# Patient Record
Sex: Female | Born: 1965 | Race: Black or African American | Hispanic: No | Marital: Married | Smoking: Current every day smoker
Health system: Southern US, Community
[De-identification: ages and names within clinical notes are randomized; demographics above are authoritative.]

## PROBLEM LIST (undated history)

## (undated) DIAGNOSIS — Z860100 Personal history of colon polyps, unspecified: Secondary | ICD-10-CM

## (undated) DIAGNOSIS — K5909 Other constipation: Secondary | ICD-10-CM

## (undated) DIAGNOSIS — K645 Perianal venous thrombosis: Secondary | ICD-10-CM

## (undated) DIAGNOSIS — I10 Essential (primary) hypertension: Secondary | ICD-10-CM

## (undated) DIAGNOSIS — E7800 Pure hypercholesterolemia, unspecified: Secondary | ICD-10-CM

## (undated) DIAGNOSIS — E119 Type 2 diabetes mellitus without complications: Secondary | ICD-10-CM

## (undated) DIAGNOSIS — M549 Dorsalgia, unspecified: Secondary | ICD-10-CM

## (undated) HISTORY — DX: Perianal venous thrombosis: K64.5

## (undated) HISTORY — DX: Personal history of colonic polyps: Z86.010

## (undated) HISTORY — DX: Pure hypercholesterolemia, unspecified: E78.00

## (undated) HISTORY — DX: Other constipation: K59.09

## (undated) HISTORY — PX: BACK SURGERY: SHX140

## (undated) HISTORY — DX: Type 2 diabetes mellitus without complications: E11.9

## (undated) HISTORY — DX: Dorsalgia, unspecified: M54.9

## (undated) HISTORY — DX: Essential (primary) hypertension: I10

## (undated) HISTORY — DX: Personal history of colon polyps, unspecified: Z86.0100

---

## 2006-12-23 HISTORY — PX: TOTAL ABDOMINAL HYSTERECTOMY W/ BILATERAL SALPINGOOPHORECTOMY: SHX83

## 2008-09-20 ENCOUNTER — Encounter: Admission: RE | Admit: 2008-09-20 | Discharge: 2008-09-20 | Payer: Self-pay | Admitting: Neurosurgery

## 2009-06-16 ENCOUNTER — Encounter: Admission: RE | Admit: 2009-06-16 | Discharge: 2009-06-16 | Payer: Self-pay | Admitting: Orthopedic Surgery

## 2009-08-30 ENCOUNTER — Encounter (HOSPITAL_COMMUNITY): Admission: RE | Admit: 2009-08-30 | Discharge: 2009-11-02 | Payer: Self-pay | Admitting: Orthopedic Surgery

## 2009-11-26 DIAGNOSIS — K5792 Diverticulitis of intestine, part unspecified, without perforation or abscess without bleeding: Secondary | ICD-10-CM

## 2009-11-26 HISTORY — DX: Diverticulitis of intestine, part unspecified, without perforation or abscess without bleeding: K57.92

## 2010-02-20 IMAGING — CT CT CHEST W/ CM
3 of 4 series · 16 of 30 positions shown, 17 images · IV contrast (75CC OMNI 300)
Comparison: None

CLINICAL DATA: Chest wall pain, smoker.

CT CHEST WITH CONTRAST
TECHNIQUE: Multidetector CT imaging of the chest was performed
following the standard protocol during bolus administration of
intravenous contrast.
Contrast: 75 ml of omni 300

[Series 3: routine chest · axial · 0.70mm/px · z∈[-202,-32]mm · 4 of 58 slices shown, 5 images]
[im 12/58  mediastinal]
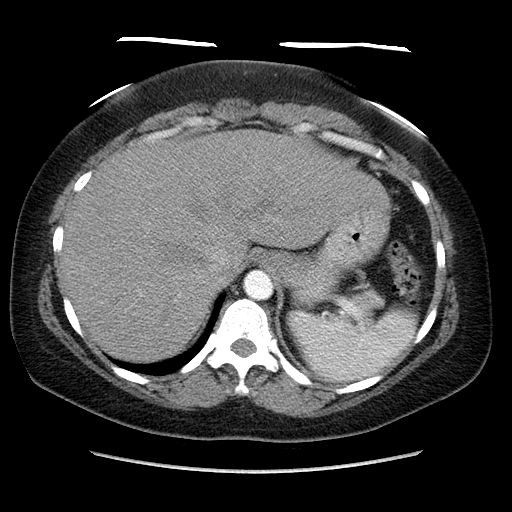
[im 12/58  lung]
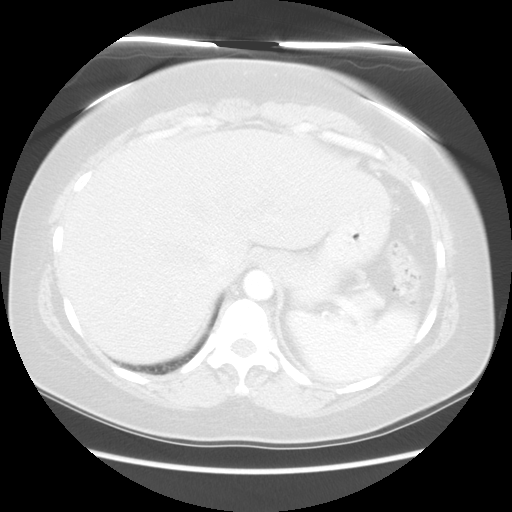
[im 23/58  lung]
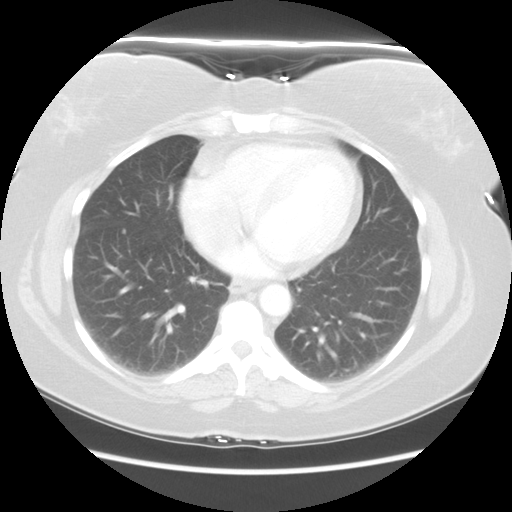
[im 35/58  lung]
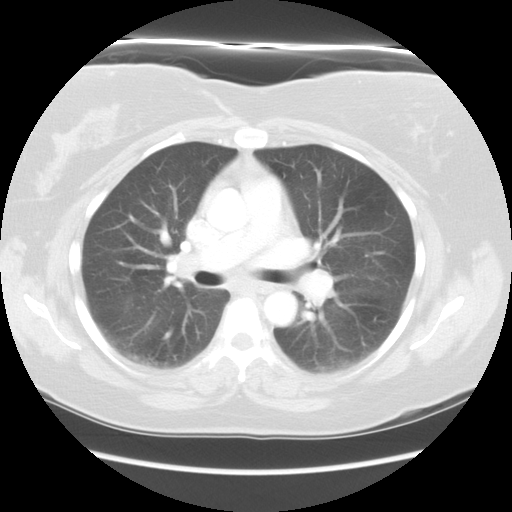
[im 46/58  lung]
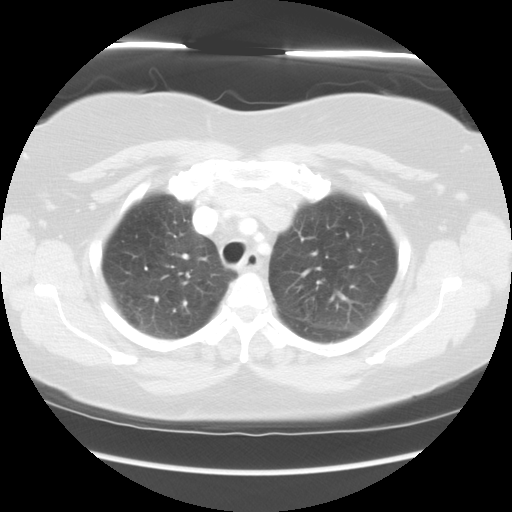

[Series 4: lung windows · axial · 0.70mm/px · z∈[-182,-22]mm · 4 of 54 slices shown]
[im 11/54  lung]
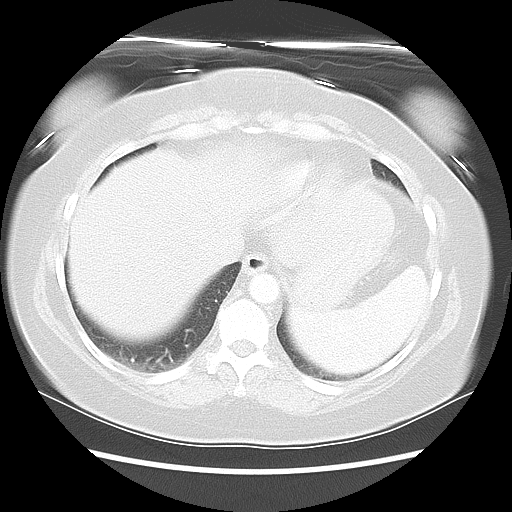
[im 22/54  lung]
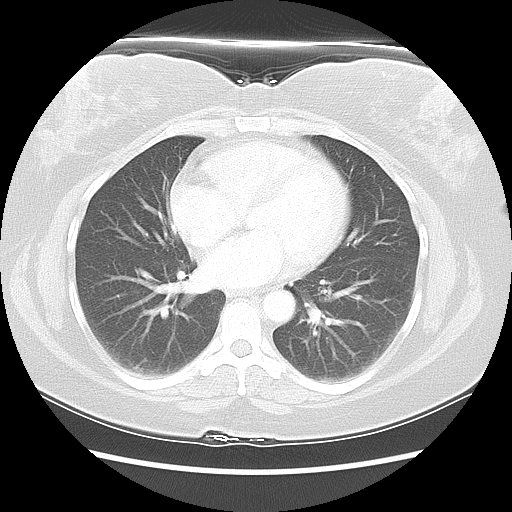
[im 32/54  lung]
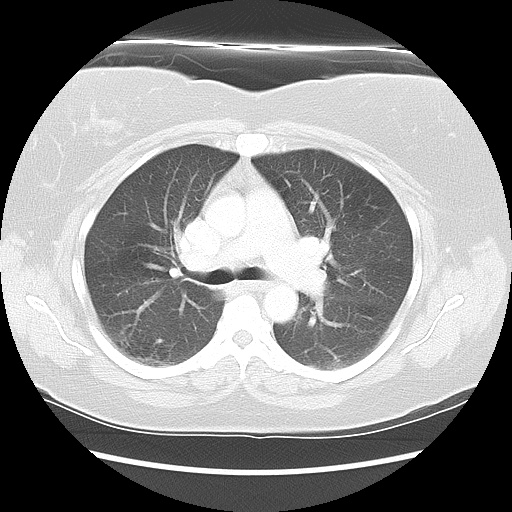
[im 43/54  lung]
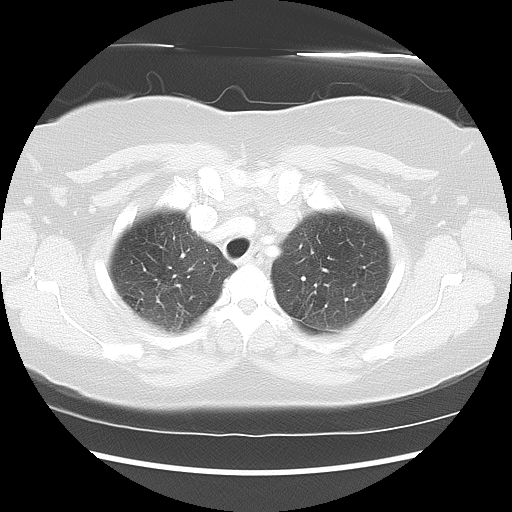

[Series 602: sagittal body · sagittal · 0.70mm/px · 8 of 145 slices shown]
[im 10/145  mediastinal]
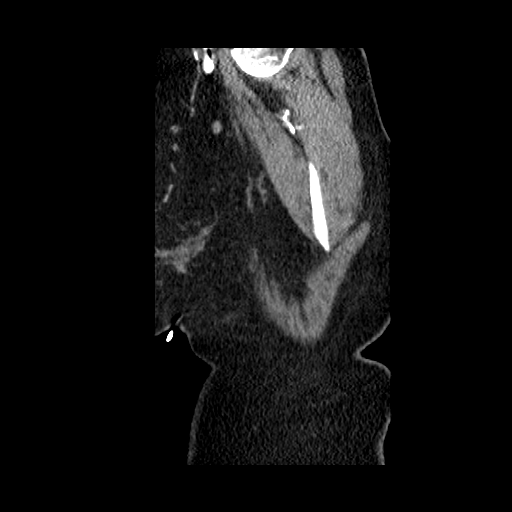
[im 29/145  mediastinal]
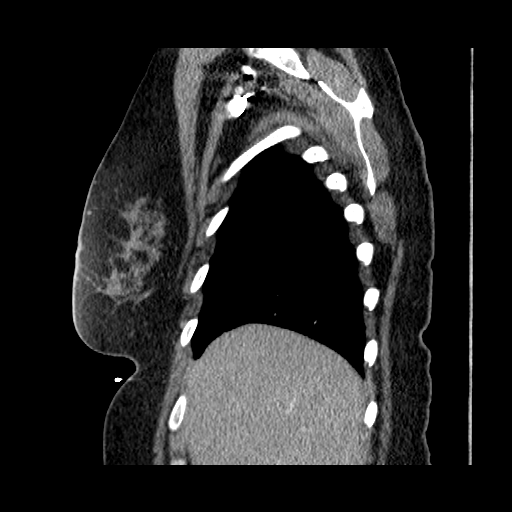
[im 49/145  mediastinal]
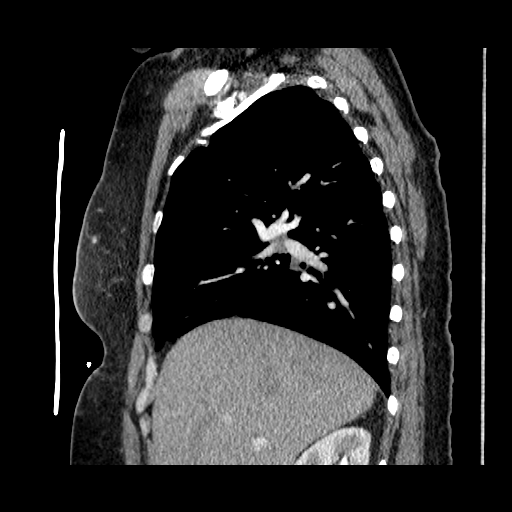
[im 68/145  mediastinal]
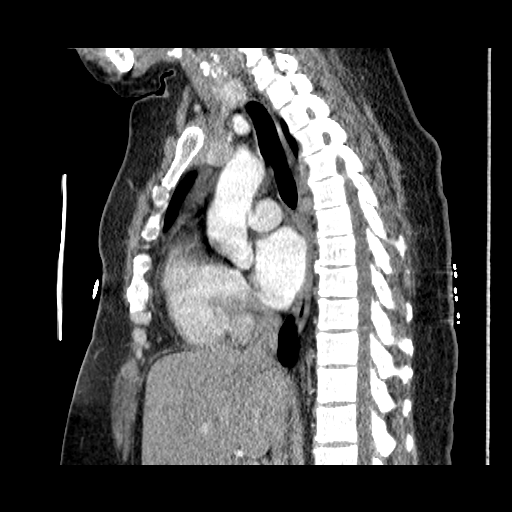
[im 77/145  mediastinal]
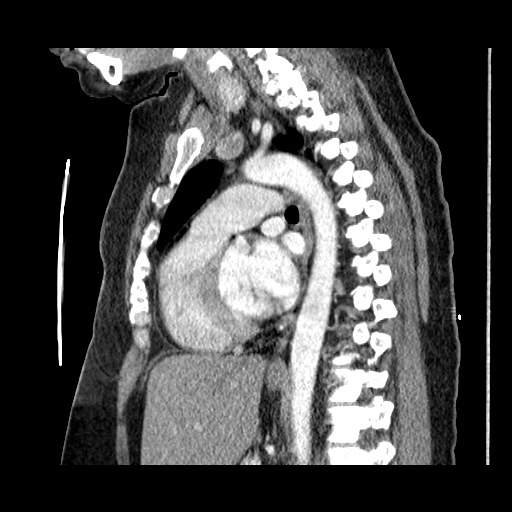
[im 97/145  mediastinal]
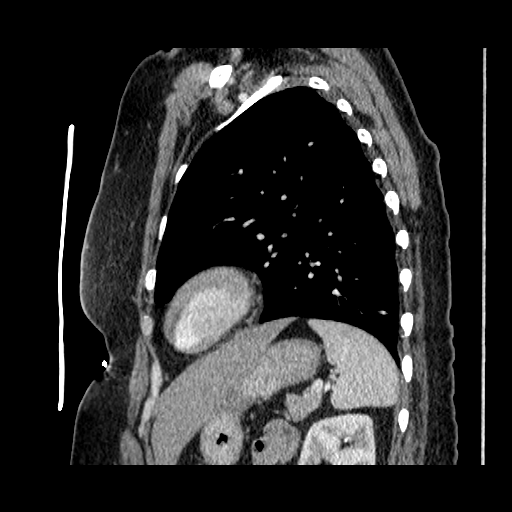
[im 116/145  mediastinal]
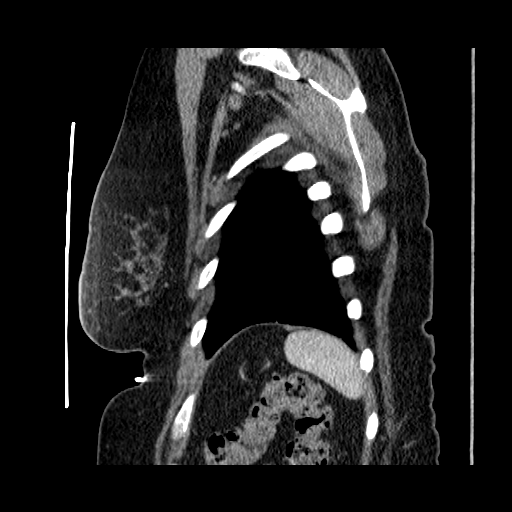
[im 135/145  mediastinal]
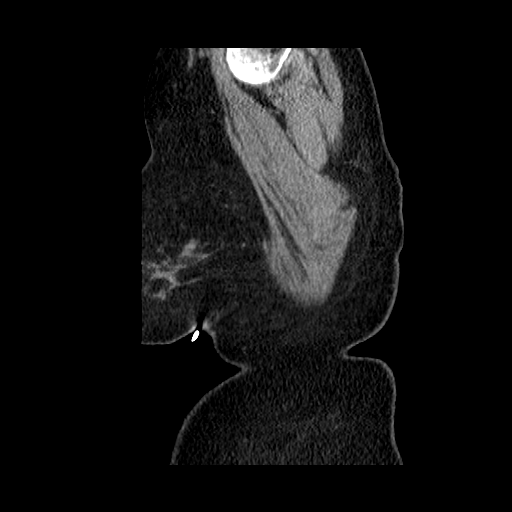

[16 of 30 positions shown; findings below may reference images not displayed]

FINDINGS: No enlarged axillary or supraclavicular lymph nodes.

There are no pathologically enlarged mediastinal or hilar lymph
nodes.

No pericardial or pleural effusion.

The lungs are mildly emphysematous.

No worrisome nodules or masses.

Review of the visualized osseous structures shows no worrisome bone
lesions.

No body wall mass identified.
IMPRESSION: 1.  No acute findings.
2.  Emphysema.

## 2015-12-27 HISTORY — PX: COLONOSCOPY: SHX174

## 2021-09-26 ENCOUNTER — Encounter: Payer: Self-pay | Admitting: Gastroenterology

## 2021-10-29 ENCOUNTER — Ambulatory Visit (INDEPENDENT_AMBULATORY_CARE_PROVIDER_SITE_OTHER): Payer: Self-pay | Admitting: Gastroenterology

## 2021-10-29 ENCOUNTER — Other Ambulatory Visit (INDEPENDENT_AMBULATORY_CARE_PROVIDER_SITE_OTHER): Payer: BC Managed Care – PPO

## 2021-10-29 ENCOUNTER — Encounter: Payer: Self-pay | Admitting: Gastroenterology

## 2021-10-29 VITALS — BP 148/98 | HR 80 | Ht 67.0 in | Wt 245.3 lb

## 2021-10-29 DIAGNOSIS — K581 Irritable bowel syndrome with constipation: Secondary | ICD-10-CM

## 2021-10-29 DIAGNOSIS — K625 Hemorrhage of anus and rectum: Secondary | ICD-10-CM

## 2021-10-29 LAB — CBC WITH DIFFERENTIAL/PLATELET
Basophils Absolute: 0.1 10*3/uL (ref 0.0–0.1)
Basophils Relative: 0.8 % (ref 0.0–3.0)
Eosinophils Absolute: 0.3 10*3/uL (ref 0.0–0.7)
Eosinophils Relative: 3.3 % (ref 0.0–5.0)
HCT: 40.4 % (ref 36.0–46.0)
Hemoglobin: 13.4 g/dL (ref 12.0–15.0)
Lymphocytes Relative: 34.1 % (ref 12.0–46.0)
Lymphs Abs: 2.8 10*3/uL (ref 0.7–4.0)
MCHC: 33.1 g/dL (ref 30.0–36.0)
MCV: 91.4 fl (ref 78.0–100.0)
Monocytes Absolute: 0.5 10*3/uL (ref 0.1–1.0)
Monocytes Relative: 5.6 % (ref 3.0–12.0)
Neutro Abs: 4.6 10*3/uL (ref 1.4–7.7)
Neutrophils Relative %: 56.2 % (ref 43.0–77.0)
Platelets: 251 10*3/uL (ref 150.0–400.0)
RBC: 4.42 Mil/uL (ref 3.87–5.11)
RDW: 14 % (ref 11.5–15.5)
WBC: 8.2 10*3/uL (ref 4.0–10.5)

## 2021-10-29 MED ORDER — HYDROCORTISONE (PERIANAL) 2.5 % EX CREA: 1.0000 "application " | TOPICAL_CREAM | Freq: Two times a day (BID) | CUTANEOUS | 2 refills | Status: AC

## 2021-10-29 MED ORDER — NA SULFATE-K SULFATE-MG SULF 17.5-3.13-1.6 GM/177ML PO SOLN: 1.0000 | Freq: Once | ORAL | 0 refills | Status: AC

## 2021-10-29 NOTE — Patient Instructions (Addendum)
If you are age 58 or older, your body mass index should be between 23-30. Your Body mass index is 38.42 kg/m?Marland Kitchen If this is out of the aforementioned range listed, please consider follow up with your Primary Care Provider. ? ?If you are age 27 or younger, your body mass index should be between 19-25. Your Body mass index is 38.42 kg/m?Marland Kitchen If this is out of the aformentioned range listed, please consider follow up with your Primary Care Provider.  ? ?________________________________________________________ ? ?The [facility] providers would like to encourage you to use [facility] to communicate with providers for non-urgent requests or questions.  Due to long hold times on the telephone, sending your provider a message by [facility] may be a faster and more efficient way to get a response.  Please allow 48 business hours for a response.  Please remember that this is for non-urgent requests.  ?_______________________________________________________  ? ?Please go to the lab on the 2nd floor [address] before you leave the office today.  ? ?We have sent the following medications to your pharmacy for you to pick up at your convenience: ?[place] ?Anusol Cream ? ?Please purchase the following medications over the counter and take as directed: ?Miralax 17g daily in [place] water ? ?Continue stool softerns. ? ?Two days before your procedure: Mix 3 packs (or capfuls) of Miralax in 48 ounces of clear liquid and drink at 6pm. ? ?You have been scheduled for a colonoscopy. Please follow written instructions given to you at your visit today.  ?Please pick up your prep supplies at the pharmacy within the next 1-3 days. ?If you use inhalers (even only as needed), please bring them with you on the day of your procedure. ? ?Please call with any questions or concerns. ? ?Thank you, ? ?Dr. Jackquline Denmark ? ? ? ? ? ?We want to thank you for trusting [facility] with your care. All of our staff and providers value the relationships  we have built with our patients, and it is an honor to care for you.  ? ?We are writing to let you know that [facility] will close on Dec 10, 2021, and we invite you to continue to see Dr. Carmell Austria and Gerrit Heck at the [facility] [place] office location. We are consolidating our serices at these [facility] practices to better provide care. Our office staff will work with you to ensure a seamless transition.  ? ?Gerrit Heck, DO -Dr. Bryan Lemma will be movig to [facility] at [address], [place], [place] [address], effective Dec 10, 2021.  Contact [phone] to schedule an appointment with him.  ? ?Carmell Austria, MD- Dr. Lyndel Safe will be movig to [facility] at [address] [place],  [place] [address], effective Dec 10, 2021.  Contact [phone] to schedule an appointment with him.  ? ?Requesting Medical Records ?If you need to request your medical records, please follow the instructions below. Your medical records are confidential, and a copy can be transferred to another provider or released to you or another person you designate only with your permission. ? ?There are several ways to request your medical records: ?Requests for medical records can be submitted through our practice.   ?You can also request your records electronically, in your MyChart account by selecting the ?Request Health Records? tab.  ?If you need additional information on how to request records, please go to [email or web address] choose Patient Information, then select Request Medical Records. ?To make  an appointment or if you have any questions about your health care needs, please contact our office at [phone] and one of our staff members will be glad to assist you. ?[facility] is committed to providing exceptional care for you and our community. Thank you for allowing [place] to serve your health care needs. ?Sincerely, ? ?Windy Canny, Director [facility] ?[facility] also offers convenient virtual care options. Sore throat? Sinus problems? Cold or flu symptoms? Get care from the comfort of home with [facility] Video Visits and e-Visits. Learn more about the non-emergency conditions treated and start your virtual visit at [email or web address] ? ?

## 2021-10-29 NOTE — Progress Notes (Signed)
? ? ?Chief Complaint:  ? ?Referring Provider:  No ref. provider found    ? ? ?ASSESSMENT AND PLAN;  ? ?#1. Rectal bleeding. D/d hoids, AVMs, colitis, polyps, stercoral ulcers etc, r/o colonic neoplasms or IBD. ? ?#2. IBS-C (with assoc [facility]) ? ?Plan: ?-Colon with 2 day prep ?-CBC today ?-Add Miralax 17g po qd ?-HC 2.5% BID PR x 10 days, 6RF ?-Continie stool softners ? ? ?Discussed risks & benefits of colonoscopy. Risks including rare perforation req laparotomy, bleeding after bx/polypectomy req blood transfusion, rarely missing neoplasms, risks of anesthesia/sedation, rare risk of damage to internal organs. Benefits outweigh the risks. Patient agrees to proceed. All the questions were answered. Pt consents to proceed. ?HPI:   ? ?Jill Wagner is a 56 y.o. female  ?DM2, LBP d/t DJD on meloxicam/percocet, HTN, HLD, asthma/COPD with continued smoking, TAH w/t BSO ? ?C/O Rectal bleed x 1 month-intermittent, mostly bright red in color, at times mixed with the stool.  Denies having any perirectal pain. ? ?Associated constipation, better with stools softners ?Lower abdo pain with abdominal bloating, which gets better with defecation. ?She has been drinking plenty of water. ? ?Unfortunately has to take Percocet for back pain.  She has been using minimal effective dose. ? ?No nausea, vomiting, heartburn, regurgitation, odynophagia or dysphagia. ? ?Nl CBC, CMP 06/25/2021 ? ?Past GI work-up: ?Colonoscopy 11/2015([facility]): Colonic polyps s/p polypectomy, moderate predominantly sigmoid diverticulosis, small internal and external hemorrhoids. Bx-hyperplastic. ? ?SH-production specialist with [employer] ?Past Medical History:  ?Diagnosis Date  ? Acute diverticulitis 11/2009  ? with perforation contained abscess. Followed by Dr Pauletta Browns  ? Back pain   ? Chronic constipation   ? Diabetes ([place])   ? Hemorrhoids, internal, thrombosed   ? on colonoscopy 12-27-2015 with associated constipation  ? High cholesterol   ? History of colon polyps   ?  HTN (hypertension)   ? ? ?Past Surgical History:  ?Procedure Laterality Date  ? BACK SURGERY    ? COLONOSCOPY  12/27/2015  ? Colonic polyps status post polypectomy. Moderate predominantly sigmoid diverticulosis. Small internal and external hemorrhoids.  ? TOTAL ABDOMINAL HYSTERECTOMY W/ BILATERAL SALPINGOOPHORECTOMY  12/23/2006  ? Done by Dr Rolena Infante  ? ? ?Family History  ?Problem Relation Age of Onset  ? Colon cancer Neg Hx   ? Rectal cancer Neg Hx   ? Stomach cancer Neg Hx   ? Esophageal cancer Neg Hx   ? ? ?Social History  ? ?Tobacco Use  ? Smoking status: Every Day  ?  Types: Cigarettes  ? Smokeless tobacco: Never  ?Vaping Use  ? Vaping Use: Never used  ?Substance Use Topics  ? Alcohol use: Yes  ?  Comment: occ  ? Drug use: Never  ? ? ?Current Outpatient Medications  ?Medication Sig Dispense Refill  ? albuterol (VENTOLIN HFA) 108 (90 Base) MCG/ACT inhaler Inhale into the lungs.    ? ALPRAZolam (XANAX) 0.5 MG tablet TAKE 1 TABLET BY MOUTH EVERY 12 HOURS AS NEEDED FOR RESCUE/ANXIETY. REFILLS AFTER PRESCRIPTION EXPIRATION REQUIRE FOLLOW-UP EVALUATION AT THE    ? Cholecalciferol 25 MCG (1000 UT) tablet Take by mouth.    ? Cyanocobalamin 1000 MCG TBCR Take 1 tablet by mouth daily.    ? docusate sodium (COLACE) 50 MG capsule Take by mouth.    ? glipiZIDE (GLUCOTROL XL) 5 MG 24 hr tablet Take 5 mg by mouth daily.    ? hydrochlorothiazide (HYDRODIURIL) 12.5 MG tablet Take 12.5 mg by mouth daily.    ? lisinopril (ZESTRIL) 10  MG tablet Take 10 mg by mouth daily.    ? meloxicam (MOBIC) 15 MG tablet Take 15 mg by mouth daily.    ? Multiple Vitamin (MULTIVITAMIN) capsule Take 1 capsule by mouth daily.    ? oxyCODONE-acetaminophen (PERCOCET) 10-325 MG tablet Take by mouth.    ? rosuvastatin (CRESTOR) 5 MG tablet Take 5 mg by mouth daily.    ? ?No current facility-administered medications for this visit.  ? ? ?Allergies  ?Allergen Reactions  ? Aspirin Other (See Comments)  ?  Abd. Pain  ? Losartan Other (See Comments)  ?   Unknown  ? Meperidine Other (See Comments)  ?  Swelling  ? ? ?Review of Systems:  ?Constitutional: Denies fever, chills, diaphoresis, appetite change and fatigue.  ?HEENT: Denies photophobia, eye pain, redness, hearing loss, ear pain, congestion, sore throat, rhinorrhea, sneezing, mouth sores, neck pain, neck stiffness and tinnitus.   ?Respiratory: Denies SOB, DOE, cough, chest tightness,  and wheezing.  Continued smoking. ?Cardiovascular: Denies chest pain, palpitations and leg swelling.  ?Genitourinary: Denies dysuria, urgency, frequency, hematuria, flank pain and difficulty urinating.  ?Musculoskeletal: Denies myalgias, back pain, joint swelling, arthralgias and gait problem.  ?Skin: No rash.  ?Neurological: Denies dizziness, seizures, syncope, weakness, light-headedness, numbness and headaches.  ?Hematological: Denies adenopathy. Easy bruising, personal or family bleeding history  ?Psychiatric/Behavioral: Has anxiety or depression ? ?  ? ?Physical Exam:   ? ?BP (!) 148/98   Pulse 80   Ht '5\' 7"'$  (1.702 m)   Wt 245 lb 5 oz (111.3 kg)   SpO2 96%   BMI 38.42 kg/m?  ?Wt Readings from Last 3 Encounters:  ?10/29/21 245 lb 5 oz (111.3 kg)  ? ?Constitutional:  Well-developed, in no acute distress. ?Psychiatric: Normal mood and affect. Behavior is normal. ?HEENT: Pupils normal.  Conjunctivae are normal. No scleral icterus. ?Cardiovascular: Normal rate, regular rhythm. No edema ?Pulmonary/chest: Effort normal and breath sounds normal. No wheezing, rales or rhonchi. ?Abdominal: Soft, nondistended. Nontender. Bowel sounds active throughout. There are no masses palpable. No hepatomegaly. ?Rectal: to be performed the time of colonoscopy ?Neurological: Alert and oriented to person place and time. ?Skin: Skin is warm and dry. No rashes noted. ? ? ? ? ? ?Carmell Austria, MD 10/29/2021, 8:41 AM ? ?Cc: No ref. provider found ? ? ?

## 2021-12-10 ENCOUNTER — Encounter: Payer: Self-pay | Admitting: Gastroenterology

## 2021-12-17 ENCOUNTER — Encounter: Payer: Self-pay | Admitting: Gastroenterology

## 2021-12-17 ENCOUNTER — Ambulatory Visit (AMBULATORY_SURGERY_CENTER): Payer: BC Managed Care – PPO | Admitting: Gastroenterology

## 2021-12-17 VITALS — BP 155/95 | HR 75 | Temp 98.6°F | Resp 22 | Ht 67.0 in | Wt 245.0 lb

## 2021-12-17 DIAGNOSIS — D125 Benign neoplasm of sigmoid colon: Secondary | ICD-10-CM

## 2021-12-17 DIAGNOSIS — K625 Hemorrhage of anus and rectum: Secondary | ICD-10-CM

## 2021-12-17 DIAGNOSIS — K621 Rectal polyp: Secondary | ICD-10-CM

## 2021-12-17 DIAGNOSIS — K635 Polyp of colon: Secondary | ICD-10-CM

## 2021-12-17 DIAGNOSIS — D128 Benign neoplasm of rectum: Secondary | ICD-10-CM

## 2021-12-17 DIAGNOSIS — K581 Irritable bowel syndrome with constipation: Secondary | ICD-10-CM

## 2021-12-17 MED ORDER — SODIUM CHLORIDE 0.9 % IV SOLN: 500.0000 mL | Freq: Once | INTRAVENOUS | Status: DC

## 2021-12-17 MED ORDER — HYDROCORTISONE (PERIANAL) 2.5 % EX CREA: 1.0000 "application " | TOPICAL_CREAM | Freq: Two times a day (BID) | CUTANEOUS | 2 refills | Status: AC

## 2021-12-17 NOTE — Op Note (Signed)
[facility] Patient Name: Jill Wagner Procedure Date: 12/17/2021 2:51 PM MRN: [id number] Endoscopist: Jackquline Denmark , MD Age: 56 Referring MD:  Date of Birth: 1966-07-12 Gender: Female Account #: [id number] Procedure:                Colonoscopy Indications:              Rectal bleeding Medicines:                Monitored Anesthesia Care Procedure:                Pre-Anesthesia Assessment:                           - Prior to the procedure, a History and Physical                            was performed, and patient medications and                            allergies were reviewed. The patient's tolerance of                            previous anesthesia was also reviewed. The risks                            and benefits of the procedure and the sedation                            options and risks were discussed with the patient.                            All questions were answered, and informed consent                            was obtained. Prior Anticoagulants: The patient has                            taken no previous anticoagulant or antiplatelet                            agents. ASA Grade Assessment: II - A patient with                            mild systemic disease. After reviewing the risks                            and benefits, the patient was deemed in                            satisfactory condition to undergo the procedure.                           After obtaining informed consent, the colonoscope  was passed under direct vision. Throughout the                            procedure, the patient's blood pressure, pulse, and                            oxygen saturations were monitored continuously. The                            PCF-HQ190L Colonoscope was introduced through the                            anus and advanced to the 2 cm into the ileum. The                            colonoscopy was performed without difficulty.  The                            patient tolerated the procedure well. The quality                            of the bowel preparation was good. The terminal                            ileum, ileocecal valve, appendiceal orifice, and                            rectum were photographed. Scope In: 3:06:33 PM Scope Out: 3:20:36 PM Scope Withdrawal Time: 0 hours 11 minutes 59 seconds  Total Procedure Duration: 0 hours 14 minutes 3 seconds  Findings:                 Two sessile polyps were found in the rectum and                            distal sigmoid colon. The polyps were 2 to 4 mm in                            size. These polyps were removed with a cold snare.                            Resection and retrieval were complete.                           Multiple medium-mouthed diverticula were found in                            the sigmoid colon.                           Non-bleeding external and internal hemorrhoids were                            found during retroflexion and during perianal exam.  The hemorrhoids were moderate.                           The terminal ileum appeared normal.                           The exam was otherwise without abnormality on                            direct and retroflexion views. Complications:            No immediate complications. Estimated Blood Loss:     Estimated blood loss: none. Impression:               - Two 2 to 4 mm polyps in the rectum and in the                            distal sigmoid colon, removed with a cold snare.                            Resected and retrieved.                           - Moderate sigmoid diverticulosis.                           - Non-bleeding external and internal hemorrhoids.                           - The examined portion of the ileum was normal.                           - The examination was otherwise normal on direct                            and retroflexion  views. Recommendation:           - Patient has a contact number available for                            emergencies. The signs and symptoms of potential                            delayed complications were discussed with the                            patient. Return to normal activities tomorrow.                            Written discharge instructions were provided to the                            patient.                           - Resume previous diet.                           -  Continue present medications.                           - Await pathology results.                           - Repeat colonoscopy for surveillance based on                            pathology results.                           - Use HC Cream 2.5%: Apply externally BID for 10                            days prn 2RF.                           - If still with problems with hemorrhoids, would                            consider surgical eval for possible                            hemorrhoidectomy.                           - The findings and recommendations were discussed                            with the patient's family. Jackquline Denmark, MD 12/17/2021 3:25:43 PM This report has been signed electronically.

## 2021-12-17 NOTE — Progress Notes (Signed)
PT taken to PACU. Monitors in place. VSS. Report given to RN. 

## 2021-12-17 NOTE — Progress Notes (Signed)
I have reviewed the patient's medical history in detail and updated the computerized patient record.   VS BY CW. 

## 2021-12-17 NOTE — Progress Notes (Signed)
Called to room to assist during endoscopic procedure.  Patient ID and intended procedure confirmed with present staff. Received instructions for my participation in the procedure from the performing physician.  

## 2021-12-17 NOTE — Patient Instructions (Signed)
Resume previous medications.  2 polyps removed and sent to pathology.  Await results for final recommendations.    Handouts on findings given to patient (polyps, diverticulosis and hemorrhoids)  YOU HAD AN ENDOSCOPIC PROCEDURE TODAY AT [facility]:   Refer to the procedure report that was given to you for any specific questions about what was found during the examination.  If the procedure report does not answer your questions, please call your gastroenterologist to clarify.  If you requested that your care partner not be given the details of your procedure findings, then the procedure report has been included in a sealed envelope for you to review at your convenience later.  YOU SHOULD EXPECT: Some feelings of bloating in the abdomen. Passage of more gas than usual.  Walking can help get rid of the air that was put into your GI tract during the procedure and reduce the bloating. If you had a lower endoscopy (such as a colonoscopy or flexible sigmoidoscopy) you may notice spotting of blood in your stool or on the toilet paper. If you underwent a bowel prep for your procedure, you may not have a normal bowel movement for a few days.  Please Note:  You might notice some irritation and congestion in your nose or some drainage.  This is from the oxygen used during your procedure.  There is no need for concern and it should clear up in a day or so.  SYMPTOMS TO REPORT IMMEDIATELY:  Following lower endoscopy (colonoscopy or flexible sigmoidoscopy):  Excessive amounts of blood in the stool  Significant tenderness or worsening of abdominal pains  Swelling of the abdomen that is new, acute  Fever of 100F or higher   For urgent or emergent issues, a gastroenterologist can be reached at any hour by calling [phone]. Do not use MyChart messaging for urgent concerns.    DIET:  We do recommend a small meal at first, but then you may proceed to your regular diet.  Drink plenty of  fluids but you should avoid alcoholic beverages for 24 hours.  ACTIVITY:  You should plan to take it easy for the rest of today and you should NOT DRIVE or use heavy machinery until tomorrow (because of the sedation medicines used during the test).    FOLLOW UP: Our staff will call the number listed on your records 48-72 hours following your procedure to check on you and address any questions or concerns that you may have regarding the information given to you following your procedure. If we do not reach you, we will leave a message.  We will attempt to reach you two times.  During this call, we will ask if you have developed any symptoms of COVID 19. If you develop any symptoms (ie: fever, flu-like symptoms, shortness of breath, cough etc.) before then, please call [phone].  If you test positive for Covid 19 in the 2 weeks post procedure, please call and report this information to [place].    If any biopsies were taken you will be contacted by phone or by letter within the next 1-3 weeks.  Please call us at [phone] if you have not heard about the biopsies in 3 weeks.    SIGNATURES/CONFIDENTIALITY: You and/or your care partner have signed paperwork which will be entered into your electronic medical record.  These signatures attest to the fact that that the information above on your After Visit Summary has been reviewed and is understood.  Full responsibility of  the confidentiality of this discharge information lies with you and/or your care-partner.

## 2021-12-18 ENCOUNTER — Telehealth: Payer: Self-pay

## 2021-12-18 NOTE — Telephone Encounter (Signed)
No answer, left message to call back later today, B.Jahmari Esbenshade RN. 

## 2021-12-18 NOTE — Telephone Encounter (Signed)
  Follow up Call-     12/17/2021    2:14 PM  Call back number  Post procedure Call Back phone  # [phone]  Permission to leave phone message Yes     Patient questions:  Do you have a fever, pain , or abdominal swelling? No. Pain Score  0 *  Have you tolerated food without any problems? Yes.    Have you been able to return to your normal activities? Yes.    Do you have any questions about your discharge instructions: Diet   No. Medications  No. Follow up visit  No.  Do you have questions or concerns about your Care? No.  Actions: * If pain score is 4 or above: No action needed, pain <4.

## 2021-12-21 ENCOUNTER — Encounter: Payer: Self-pay | Admitting: Gastroenterology

## 2023-10-01 ENCOUNTER — Encounter: Payer: Self-pay | Admitting: Specialist

## 2023-12-04 ENCOUNTER — Ambulatory Visit: Admitting: Podiatry

## 2023-12-04 ENCOUNTER — Ambulatory Visit (INDEPENDENT_AMBULATORY_CARE_PROVIDER_SITE_OTHER)

## 2023-12-04 DIAGNOSIS — M19072 Primary osteoarthritis, left ankle and foot: Secondary | ICD-10-CM

## 2023-12-04 DIAGNOSIS — M778 Other enthesopathies, not elsewhere classified: Secondary | ICD-10-CM

## 2023-12-04 NOTE — Progress Notes (Signed)
 Chief Complaint  Patient presents with   Foot Pain    Left lateral foot pain. Been present for 2 week. She has been using Volteran, and a foot massage, but no relief. She states no injury that she can remember. Not diabetic and no anti coag. Xrays up .    HPI: 58 y.o. female presenting today with c/o pain along the dorsal aspect of the left midfoot along the tarsometatarsal joints when pointing with her finger.  Patient states that she takes pain medication currently for her back but it does not improve the pain in her midfoot.  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  She wants to hold off on any additional oral medication.  Past Medical History:  Diagnosis Date   Acute diverticulitis 11/2009   with perforation contained abscess. Followed by Dr Maple Seltzer   Back pain    Chronic constipation    Diabetes (HCC)    Hemorrhoids, internal, thrombosed    on colonoscopy 12-27-2015 with associated constipation   High cholesterol    History of colon polyps    HTN (hypertension)     Past Surgical History:  Procedure Laterality Date   BACK SURGERY     COLONOSCOPY  12/27/2015   Colonic polyps status post polypectomy. Moderate predominantly sigmoid diverticulosis. Small internal and external hemorrhoids.   TOTAL ABDOMINAL HYSTERECTOMY W/ BILATERAL SALPINGOOPHORECTOMY  12/23/2006   Done by Dr Vaughn Georges    Allergies  Allergen Reactions   Aspirin Other (See Comments)    Abd. Pain   Losartan Other (See Comments)    Unknown   Meperidine Other (See Comments)    Swelling    Review of Systems  Musculoskeletal:  Positive for joint pain.      PHYSICAL EXAM:  General: The patient is alert and oriented x3 in no acute distress.  Dermatology: Skin is warm, dry and supple bilateral lower extremities. Interspaces are clear of maceration and debris.  No rashes noted.   Vascular: Palpable dorsalis pedis and posterior tibial pulses bilateral. Capillary refill within  normal limits.  No appreciable edema.  No erythema or calor.  Neurological: Light touch sensation grossly intact bilateral feet.   Musculoskeletal Exam:  There is pain on palpation of the dorsal aspect of the left midfoot.  Most pain is palpated at the third metatarsal-lateral cuneiform joint.  Movement of the midtarsal joint also aggravates pain in this area.  RADIOGRAPHIC EXAM (left foot, 3 weightbearing views, 12/04/2023):  No fracture noted.  Joint space narrowing at the 2nd and 3rd metatarsal-cuneiform joints.  There is also some dorsal spurring at the intermediate cuneiform-navicular joint.  No fracture seen.  ASSESSMENT/PLAN OF CARE: 1. Arthritis of midtarsal joint of left foot   2. Capsulitis of left foot    Discussed patient's condition and possible etiologies today.  Discussed conservative treatment options with patient today, including shoe modification / arch supports, off-loading, cortisone injectione, NSAID topical / oral therapy, and toe splints and shields.  Briefly discussed surgical intervention if conservative options are not successful.    Power steps were fitted and dispensed as a courtesy today.  A corticosteroid injection was administered to the dorsal left midfoot at the third metatarsal-lateral cuneiform joint following a sterile skin prep.  This consisted of a mixture of 1% lidocaine plain, 0.5% Marcaine plain, and Kenalog 10 for total of 1.25 cc administered.  A Band-Aid was applied.  Nonsteroidals were not prescribed as  preferred by the patient  Return if symptoms worsen or fail to improve.    Joe Murders, DPM, FACFAS [facility]     [address] [place], [place] [address]                Office [phone]  Fax [phone]
